# Patient Record
Sex: Female | Born: 1965 | Hispanic: Yes | Marital: Married | Smoking: Never smoker
Health system: Southern US, Community
[De-identification: ages and names within clinical notes are randomized; demographics above are authoritative.]

## PROBLEM LIST (undated history)

## (undated) HISTORY — PX: ABDOMINAL HYSTERECTOMY: SHX81

---

## 2004-11-05 ENCOUNTER — Emergency Department: Payer: Self-pay | Admitting: Emergency Medicine

## 2005-04-15 ENCOUNTER — Emergency Department: Payer: Self-pay | Admitting: Emergency Medicine

## 2007-07-08 ENCOUNTER — Other Ambulatory Visit: Payer: Self-pay

## 2007-07-08 ENCOUNTER — Emergency Department: Payer: Self-pay | Admitting: Emergency Medicine

## 2012-05-10 ENCOUNTER — Emergency Department: Payer: Self-pay | Admitting: Emergency Medicine

## 2013-07-27 ENCOUNTER — Emergency Department: Payer: Self-pay | Admitting: Emergency Medicine

## 2013-07-27 LAB — CBC
MCH: 26.6 pg (ref 26.0–34.0)
MCHC: 33.7 g/dL (ref 32.0–36.0)
MCV: 79 fL — ABNORMAL LOW (ref 80–100)
RBC: 5 10*6/uL (ref 3.80–5.20)
RDW: 17.4 % — ABNORMAL HIGH (ref 11.5–14.5)

## 2013-07-27 LAB — URINALYSIS, COMPLETE
Glucose,UR: NEGATIVE mg/dL (ref 0–75)
Nitrite: NEGATIVE
Ph: 7 (ref 4.5–8.0)
Protein: 100
RBC,UR: 9176 /HPF (ref 0–5)
Specific Gravity: 1.012 (ref 1.003–1.030)
WBC UR: 1306 /HPF (ref 0–5)

## 2013-07-27 LAB — WET PREP, GENITAL

## 2013-07-27 LAB — COMPREHENSIVE METABOLIC PANEL
Albumin: 3.7 g/dL (ref 3.4–5.0)
Alkaline Phosphatase: 119 U/L (ref 50–136)
BUN: 15 mg/dL (ref 7–18)
Bilirubin,Total: 0.3 mg/dL (ref 0.2–1.0)
Calcium, Total: 9 mg/dL (ref 8.5–10.1)
Chloride: 103 mmol/L (ref 98–107)
Creatinine: 0.88 mg/dL (ref 0.60–1.30)
EGFR (African American): >60
Glucose: 108 mg/dL — ABNORMAL HIGH (ref 65–99)
Osmolality: 273 (ref 275–301)
SGPT (ALT): 24 U/L (ref 12–78)

## 2013-07-27 LAB — HCG, QUANTITATIVE, PREGNANCY: Beta Hcg, Quant.: <1 m[IU]/mL — ABNORMAL LOW

## 2013-07-28 LAB — GC/CHLAMYDIA PROBE AMP

## 2013-11-28 ENCOUNTER — Emergency Department: Payer: Self-pay | Admitting: Emergency Medicine

## 2013-11-28 LAB — CBC
HCT: 37.7 % (ref 35.0–47.0)
HGB: 11.6 g/dL — ABNORMAL LOW (ref 12.0–16.0)
MCH: 24.6 pg — ABNORMAL LOW (ref 26.0–34.0)
MCHC: 30.9 g/dL — ABNORMAL LOW (ref 32.0–36.0)
MCV: 80 fL (ref 80–100)
PLATELETS: 412 10*3/uL (ref 150–440)
RBC: 4.73 10*6/uL (ref 3.80–5.20)
RDW: 17.2 % — ABNORMAL HIGH (ref 11.5–14.5)
WBC: 15.8 10*3/uL — AB (ref 3.6–11.0)

## 2013-11-28 LAB — COMPREHENSIVE METABOLIC PANEL
ALT: 22 U/L (ref 12–78)
Albumin: 3.4 g/dL (ref 3.4–5.0)
Alkaline Phosphatase: 131 U/L — ABNORMAL HIGH
Anion Gap: 7 (ref 7–16)
BILIRUBIN TOTAL: 0.3 mg/dL (ref 0.2–1.0)
BUN: 13 mg/dL (ref 7–18)
CALCIUM: 9 mg/dL (ref 8.5–10.1)
CO2: 25 mmol/L (ref 21–32)
CREATININE: 0.6 mg/dL (ref 0.60–1.30)
Chloride: 105 mmol/L (ref 98–107)
EGFR (African American): >60
GLUCOSE: 111 mg/dL — AB (ref 65–99)
Osmolality: 275 (ref 275–301)
Potassium: 4 mmol/L (ref 3.5–5.1)
SGOT(AST): 11 U/L — ABNORMAL LOW (ref 15–37)
SODIUM: 137 mmol/L (ref 136–145)
TOTAL PROTEIN: 8.2 g/dL (ref 6.4–8.2)

## 2013-11-28 LAB — URINALYSIS, COMPLETE
Bilirubin,UR: NEGATIVE
Glucose,UR: NEGATIVE mg/dL (ref 0–75)
Ketone: NEGATIVE
Nitrite: NEGATIVE
Ph: 6 (ref 4.5–8.0)
Protein: 100
SPECIFIC GRAVITY: 1.019 (ref 1.003–1.030)
WBC UR: 2435 /HPF (ref 0–5)

## 2013-11-30 LAB — URINE CULTURE

## 2014-01-18 LAB — URINALYSIS, COMPLETE
Bilirubin,UR: NEGATIVE
Glucose,UR: NEGATIVE mg/dL (ref 0–75)
Nitrite: NEGATIVE
PH: 6 (ref 4.5–8.0)
Protein: NEGATIVE
Specific Gravity: 1.01 (ref 1.003–1.030)
WBC UR: 6 /HPF (ref 0–5)

## 2014-01-18 LAB — CBC WITH DIFFERENTIAL/PLATELET
BASOS PCT: 0.5 %
Basophil #: 0.1 10*3/uL (ref 0.0–0.1)
Eosinophil #: 0 10*3/uL (ref 0.0–0.7)
Eosinophil %: 0.3 %
HCT: 36.5 % (ref 35.0–47.0)
HGB: 11.4 g/dL — AB (ref 12.0–16.0)
Lymphocyte #: 1.1 10*3/uL (ref 1.0–3.6)
Lymphocyte %: 7.1 %
MCH: 23.4 pg — AB (ref 26.0–34.0)
MCHC: 31.3 g/dL — ABNORMAL LOW (ref 32.0–36.0)
MCV: 75 fL — ABNORMAL LOW (ref 80–100)
MONO ABS: 0.7 x10 3/mm (ref 0.2–0.9)
Monocyte %: 4.3 %
NEUTROS ABS: 13.4 10*3/uL — AB (ref 1.4–6.5)
NEUTROS PCT: 87.8 %
Platelet: 360 10*3/uL (ref 150–440)
RBC: 4.87 10*6/uL (ref 3.80–5.20)
RDW: 17.5 % — ABNORMAL HIGH (ref 11.5–14.5)
WBC: 15.3 10*3/uL — ABNORMAL HIGH (ref 3.6–11.0)

## 2014-01-18 LAB — COMPREHENSIVE METABOLIC PANEL
ALK PHOS: 89 U/L
AST: 23 U/L (ref 15–37)
Albumin: 3.2 g/dL — ABNORMAL LOW (ref 3.4–5.0)
Anion Gap: 8 (ref 7–16)
BILIRUBIN TOTAL: 0.4 mg/dL (ref 0.2–1.0)
BUN: 7 mg/dL (ref 7–18)
Calcium, Total: 8.1 mg/dL — ABNORMAL LOW (ref 8.5–10.1)
Chloride: 106 mmol/L (ref 98–107)
Co2: 23 mmol/L (ref 21–32)
Creatinine: 0.71 mg/dL (ref 0.60–1.30)
EGFR (African American): >60
EGFR (Non-African Amer.): >60
Glucose: 126 mg/dL — ABNORMAL HIGH (ref 65–99)
Osmolality: 273 (ref 275–301)
Potassium: 3.5 mmol/L (ref 3.5–5.1)
SGPT (ALT): 20 U/L (ref 12–78)
Sodium: 137 mmol/L (ref 136–145)
TOTAL PROTEIN: 7.9 g/dL (ref 6.4–8.2)

## 2014-01-18 LAB — GC/CHLAMYDIA PROBE AMP

## 2014-01-18 LAB — WET PREP, GENITAL

## 2014-01-19 ENCOUNTER — Inpatient Hospital Stay: Payer: Self-pay | Admitting: Internal Medicine

## 2014-01-19 LAB — COMPREHENSIVE METABOLIC PANEL
ALBUMIN: 2.7 g/dL — AB (ref 3.4–5.0)
ANION GAP: 9 (ref 7–16)
Alkaline Phosphatase: 70 U/L
BUN: 6 mg/dL — AB (ref 7–18)
Bilirubin,Total: 0.6 mg/dL (ref 0.2–1.0)
CO2: 22 mmol/L (ref 21–32)
Calcium, Total: 7.5 mg/dL — ABNORMAL LOW (ref 8.5–10.1)
Chloride: 104 mmol/L (ref 98–107)
Creatinine: 0.86 mg/dL (ref 0.60–1.30)
EGFR (Non-African Amer.): >60
GLUCOSE: 111 mg/dL — AB (ref 65–99)
Osmolality: 268 (ref 275–301)
POTASSIUM: 3.2 mmol/L — AB (ref 3.5–5.1)
SGOT(AST): 12 U/L — ABNORMAL LOW (ref 15–37)
SGPT (ALT): 17 U/L (ref 12–78)
Sodium: 135 mmol/L — ABNORMAL LOW (ref 136–145)
TOTAL PROTEIN: 6.8 g/dL (ref 6.4–8.2)

## 2014-01-19 LAB — CBC WITH DIFFERENTIAL/PLATELET
BASOS ABS: 0 10*3/uL (ref 0.0–0.1)
Basophil %: 0.3 %
Eosinophil #: 0 10*3/uL (ref 0.0–0.7)
Eosinophil %: 0 %
HCT: 29.9 % — ABNORMAL LOW (ref 35.0–47.0)
HGB: 9.5 g/dL — ABNORMAL LOW (ref 12.0–16.0)
Lymphocyte #: 1 10*3/uL (ref 1.0–3.6)
Lymphocyte %: 7 %
MCH: 23.6 pg — ABNORMAL LOW (ref 26.0–34.0)
MCHC: 31.8 g/dL — ABNORMAL LOW (ref 32.0–36.0)
MCV: 74 fL — AB (ref 80–100)
MONO ABS: 0.7 x10 3/mm (ref 0.2–0.9)
Monocyte %: 4.9 %
NEUTROS ABS: 12.2 10*3/uL — AB (ref 1.4–6.5)
NEUTROS PCT: 87.8 %
Platelet: 282 10*3/uL (ref 150–440)
RBC: 4.05 10*6/uL (ref 3.80–5.20)
RDW: 17.4 % — AB (ref 11.5–14.5)
WBC: 13.8 10*3/uL — AB (ref 3.6–11.0)

## 2014-01-19 LAB — LIPID PANEL
Cholesterol: 143 mg/dL (ref 0–200)
HDL Cholesterol: 49 mg/dL (ref 40–60)
Ldl Cholesterol, Calc: 73 mg/dL (ref 0–100)
TRIGLYCERIDES: 104 mg/dL (ref 0–200)
VLDL CHOLESTEROL, CALC: 21 mg/dL (ref 5–40)

## 2014-01-19 LAB — URINE CULTURE

## 2014-01-19 LAB — TSH: Thyroid Stimulating Horm: 0.971 u[IU]/mL

## 2014-01-20 LAB — URINALYSIS, COMPLETE
BACTERIA: NONE SEEN
BILIRUBIN, UR: NEGATIVE
GLUCOSE, UR: NEGATIVE mg/dL (ref 0–75)
Leukocyte Esterase: NEGATIVE
Nitrite: NEGATIVE
PH: 7 (ref 4.5–8.0)
Protein: NEGATIVE
RBC,UR: <1 /HPF (ref 0–5)
SPECIFIC GRAVITY: 1.006 (ref 1.003–1.030)
WBC UR: <1 /HPF (ref 0–5)

## 2014-01-21 LAB — BASIC METABOLIC PANEL
Anion Gap: 6 — ABNORMAL LOW (ref 7–16)
BUN: 3 mg/dL — AB (ref 7–18)
CALCIUM: 7.8 mg/dL — AB (ref 8.5–10.1)
CREATININE: 0.54 mg/dL — AB (ref 0.60–1.30)
Chloride: 107 mmol/L (ref 98–107)
Co2: 27 mmol/L (ref 21–32)
EGFR (African American): >60
Glucose: 89 mg/dL (ref 65–99)
Osmolality: 275 (ref 275–301)
POTASSIUM: 3.4 mmol/L — AB (ref 3.5–5.1)
Sodium: 140 mmol/L (ref 136–145)

## 2014-01-21 LAB — TSH: THYROID STIMULATING HORM: 2.74 u[IU]/mL

## 2014-01-21 LAB — CBC WITH DIFFERENTIAL/PLATELET
Basophil #: 0 10*3/uL (ref 0.0–0.1)
Basophil %: 0.3 %
EOS ABS: 0.2 10*3/uL (ref 0.0–0.7)
Eosinophil %: 1.7 %
HCT: 26.8 % — AB (ref 35.0–47.0)
HGB: 8.6 g/dL — ABNORMAL LOW (ref 12.0–16.0)
Lymphocyte #: 1.4 10*3/uL (ref 1.0–3.6)
Lymphocyte %: 13.4 %
MCH: 23.6 pg — ABNORMAL LOW (ref 26.0–34.0)
MCHC: 32.1 g/dL (ref 32.0–36.0)
MCV: 74 fL — ABNORMAL LOW (ref 80–100)
MONO ABS: 0.6 x10 3/mm (ref 0.2–0.9)
MONOS PCT: 5.7 %
Neutrophil #: 8.5 10*3/uL — ABNORMAL HIGH (ref 1.4–6.5)
Neutrophil %: 78.9 %
PLATELETS: 295 10*3/uL (ref 150–440)
RBC: 3.63 10*6/uL — ABNORMAL LOW (ref 3.80–5.20)
RDW: 17.2 % — AB (ref 11.5–14.5)
WBC: 10.8 10*3/uL (ref 3.6–11.0)

## 2014-01-21 LAB — MAGNESIUM: MAGNESIUM: 2.1 mg/dL

## 2014-01-22 LAB — CBC WITH DIFFERENTIAL/PLATELET
BASOS ABS: 0.1 10*3/uL (ref 0.0–0.1)
Basophil %: 0.7 %
EOS ABS: 0.3 10*3/uL (ref 0.0–0.7)
EOS PCT: 3.7 %
HCT: 29.7 % — AB (ref 35.0–47.0)
HGB: 9.2 g/dL — AB (ref 12.0–16.0)
LYMPHS ABS: 1.6 10*3/uL (ref 1.0–3.6)
Lymphocyte %: 21.5 %
MCH: 23.1 pg — AB (ref 26.0–34.0)
MCHC: 31.1 g/dL — ABNORMAL LOW (ref 32.0–36.0)
MCV: 74 fL — ABNORMAL LOW (ref 80–100)
MONO ABS: 0.5 x10 3/mm (ref 0.2–0.9)
Monocyte %: 7 %
NEUTROS ABS: 5 10*3/uL (ref 1.4–6.5)
Neutrophil %: 67.1 %
PLATELETS: 379 10*3/uL (ref 150–440)
RBC: 3.99 10*6/uL (ref 3.80–5.20)
RDW: 17.4 % — AB (ref 11.5–14.5)
WBC: 7.5 10*3/uL (ref 3.6–11.0)

## 2014-01-22 LAB — IRON AND TIBC
IRON BIND. CAP.(TOTAL): 254 ug/dL (ref 250–450)
IRON SATURATION: 5 %
Iron: 12 ug/dL — ABNORMAL LOW (ref 50–170)
Unbound Iron-Bind.Cap.: 242 ug/dL

## 2014-01-22 LAB — FERRITIN: Ferritin (ARMC): 43 ng/mL (ref 8–388)

## 2014-01-23 LAB — CULTURE, BLOOD (SINGLE)

## 2015-01-16 NOTE — Consult Note (Signed)
Brief Consult Note: Diagnosis: abnormal uterine bleeding, likely secondar to fibroid uterus.   Patient was seen by consultant.   Consult note dictated.   Recommend further assessment or treatment.   Discussed with Attending MD.   Comments: She has had initial evaluation at [facility] for this issue, which has been going on for about 6 months. She has tried combined oral contraceptive pills, which she states caused her to have inflamed veins in around her knees.  So, she stopped taking them.   I recommended that she follow up in her clinic for other treatment options full evaluation to rule out malignant and premalignant causes to her heavy bleeding.   For now, I recommend provera 10mg  po qdaily x 10 days should her heavy bleeding return.  If she does not feel like she needs it in the hospital, then I recommend providing her with a prescription for one 10-day course to allow her time to be seen and evaluated on an outpatient basis. Thank you for this consult.  Electronic Signatures: Conard NovakJackson, Kimberli Winne D (MD)  (Signed 30-Apr-15 21:06)  Authored: Brief Consult Note   Last Updated: 30-Apr-15 21:06 by Conard NovakJackson, Sam Overbeck D (MD)

## 2015-01-16 NOTE — Discharge Summary (Signed)
PATIENT NAME:  Jacqueline FritzRIAS, [name]:  [id number] DATE OF BIRTH:  [birth year]  DATE OF ADMISSION:  01/19/2014 DATE OF DISCHARGE:  01/23/2014   ADMITTING PHYSICIAN: Hope PigeonVaibhavkumar G. Elisabeth PigeonVachhani, MD  DISCHARGING PHYSICIAN: Enid Baasadhika Kaidan Harpster, MD  PRIMARY CARE PHYSICIAN: Used to be at [facility], currently none.  CONSULTATIONS IN THE HOSPITAL: OB/GYN consultation by Conard NovakStephen D. Jackson, MD.   DISCHARGE DIAGNOSES: 1.  Systemic inflammatory response syndrome. 2.  Group A strep urinary tract infection.  3.  Gastroenteritis.  4.  Constipation.  5.  Dysmenorrhea. 6.  Fibroid uterus. 7.  Anxiety and panic attacks.  8.  Chronic anemia.   DISCHARGE HOME MEDICATIONS: 1.  Norco 5/325 mg 1 tablet q.6 hours p.r.n. for pain.  2.  Promethazine 25 mg q.6 hours p.r.n. for nausea and vomiting.  3.  Senna/Colace 50/8.6 mg 2 tablets once a day.  4.  Keflex 500 mg p.o. t.i.d. for 4 more days.  5.  Ferrous sulfate 325 mg p.o. daily.  6.  Celexa 20 mg p.o. daily.  7.  Provera 10 mg p.o. daily.  8.  Lactulose 30 mL daily p.r.n. for constipation.   DISCHARGE DIET: Regular diet.   DISCHARGE ACTIVITY: As tolerated.    FOLLOWUP INSTRUCTIONS:  1.  PCP followup in one week.  2.  OB/GYN followup in 1 to 2 weeks.   LABORATORY AND IMAGING STUDIES PRIOR TO DISCHARGE: WBC 7.5, hemoglobin 9.3, hematocrit 29.7; platelet count is 379.   Sodium 140, potassium 3.4, chloride 107, bicarbonate 27, BUN 3, creatinine 0.54, glucose 89, and calcium of 7.8.   HIV antibodies were negative. Serum TSH is 2.74. Serum iron low at 12. Iron saturation is 5%. Ferritin is at 43.   Urinalysis: Repeat urinalysis is negative. Initial urine cultures on admission growing group A Strep pyogenes, significant colonies greater than 100,000.   Blood cultures negative and vaginal cultures negative for any Chlamydia or gonorrhea.   CT of the head without contrast showing no acute intracranial abnormalities. Abdominal x-ray showing  nonspecific bowel gas pattern. CT of the abdomen and pelvis with contrast showing no acute abnormality, fibroid uterus and large left ovary, likely from luteal cyst. Followup recommended in 6 to 12 weeks.   BRIEF HOSPITAL COURSE: Ms. Jacqueline Pugh is a 49 year old, Spanish-speaking Hispanic female with no significant past medical history, comes to the hospital secondary to abdominal pain, nausea and vomiting.  1.  Abdominal pain with nausea, vomiting: Likely gastroenteritis and also the patient had UTI. Her urine cultures were growing significant colonies of Strep pyogenes, which is very unusual. The patient states she is sexually active, monogamous. HIV was tested, which was negative. She is placed on Rocephin in the hospital and is being changed over to Keflex. Her abdominal pain was also secondary to constipation. In spite of multiple medications, she did not have a bowel movement. However, after she had a bowel movement, most of her symptoms have improved. Abdominal x-ray did not show any obstruction. She was able to tolerate her diet well.  2.  Dysmenorrhea and fibroid uterus: The patient has been having worsening of her periods over the last 6 months. This time, she had for more than 12 days. OB/GYN has been consulted in the hospital. They recommended placing the patient on Provera for 10 days and outpatient followup. CT of the abdomen showed fibroid uterus.  3.  Anxiety episodes with symptoms of panic attacks: The patient is being started on Celexa in the hospital and needs to be followed as an  outpatient.   Her course has been otherwise uneventful in the hospital.   DISCHARGE CONDITION: Stable.   DISCHARGE DISPOSITION: Home.   TIME SPENT ON DISCHARGE: 40 minutes.    ____________________________ Enid Baas, MD rk:jcm D: 01/23/2014 13:57:52 ET T: 01/23/2014 14:51:58 ET JOB#: [id number]  cc: Enid Baas, MD, <Dictator> Enid Baas MD ELECTRONICALLY SIGNED 02/11/2014 14:02

## 2015-01-16 NOTE — H&P (Signed)
PATIENT NAME:  Jacqueline Pugh, Jacqueline Pugh MR#:  [id number] DATE OF BIRTH:  10/04/1965  DATE OF ADMISSION:  01/18/2014  PRIMARY CARE PHYSICIAN: At [facility], but not going regularly.   REFERRING EMERGENCY ROOM PHYSICIAN: Dr. Janalyn Harderavid Kaminski  CHIEF COMPLAINT: Abdominal pain.   HISTORY OF PRESENT ILLNESS: A 49 year old female with no known past medical history, but not going to a doctor anyways. She is obese and, as per her, a long time ago in the past she was told to check a thyroid sonogram for some reason, but she never went for that. Now for the last 3 days, she started having some pain, which is epigastric and both sides of the abdomen, which is more when she pressed on it. The pain is like 6 to 7 out of 10 on and off, also feeling somewhat nauseous. She felt some warm and chills yesterday. Because of this, she did not eat enough in the last 3 days, and also not drinking enough. She decided to come to the Emergency Room for this reason. In the ER, a CAT scan of abdomen was done, which does not show any significant finding, but some ovarian enlargement, to be followed in few weeks. She was given morphine and then Dilaudid injection, which helped to relieve the pain, so currently the pain is 3 to 4 out of 10, and it is only if she presses down on her abdomen. Other than this, she also had some headache, some hurting of her muscles in her lower limbs, both sides, which is gone now with IV fluids in the ER. She was found having tachycardia of 120 to 130 in ER, and given 3 to 4 liters of fluids so far, and given to hospitalist service for further management.   REVIEW OF SYSTEMS:  CONSTITUTIONAL: Had some fever and chills. Also had some fatigue. No weakness, pain or weight loss.  EYES: No blurring, double vision, discharge or redness.  EARS, NOSE, THROAT: No tinnitus, ear pain or hearing loss.  RESPIRATORY: No cough, wheezing, hemoptysis, or shortness of breath.  CARDIOVASCULAR: No chest pain, orthopnea,  edema, arrhythmia or palpitations.  GASTROINTESTINAL: The patient had some nausea, but no vomiting or diarrhea. Had abdominal pain also.  GENITOURINARY: No dysuria, hematuria, increased frequency.  ENDOCRINE: No heat or cold intolerance. No increased sweating.  SKIN: No acne, rashes, or lesions.  MUSCULOSKELETAL: No pain or swelling in the joints, but has some overall generalized pain in both legs, which is subsided now.  NEUROLOGICAL: No numbness, weakness, tremor or vertigo.  PSYCHIATRIC: No anxiety, insomnia, bipolar disorder.   PAST MEDICAL HISTORY: Not known. Not going to any doctor.   HOME MEDICATIONS: Not taking any medication at home.   PAST SURGICAL HISTORY: Had some kind of abnormal cells in her cervix, so some small procedure was done for that. She does not remember what exactly it was.  SOCIAL HISTORY: She denies any alcohol, illegal drug use, or smoking. She is a housewife.   FAMILY HISTORY: Mother had hyperlipidemia.   VITAL SIGNS: In the ER, temperature was 98.2, pulse rate was 140 on presentation, which came down currently to 110, respiration was 18, blood pressure was 104/59, and pulse ox was 96% on room air.   PHYSICAL EXAMINATION: GENERAL: The patient is fully alert and oriented to time, place, and person.  HEENT: Head and neck atraumatic. Conjunctivae pink. Oral mucosa moist.  NECK: Supple. No JVD.  RESPIRATORY: Bilaterally equal and clear air entry.  CARDIOVASCULAR: S1, S2 present. Slight tachycardia.  No murmur.  ABDOMEN: Soft. Mild tenderness in epigastric. No organomegaly felt. Bowel sounds normal.  SKIN: No rashes.  LEGS:  No edema. There is some tenderness of calf.   NEUROLOGICAL: No numbness, weakness, tremor, or vertigo. Power 5/5. Follows commands. Moves all 4 limbs.  PSYCHIATRIC: Does not appear in any acute psychiatric illness at this time.   IMPORTANT LAB RESULTS: Glucose 126, BUN 7, creatinine 0.71, sodium 137, potassium 3.5, chloride 106, CO2 is 23,  calcium 8.1. Total protein 7.9, albumin 3.2, bilirubin 0.4, alkaline phosphate 89, SGOT 23, SGPT 20. WBC 15.3, hemoglobin 11.4, platelet count 360, MCV 75. Urinalysis is borderline, with 6 WBCs and 1+ leukocyte esterase. CT scan of the abdomen and pelvis:  No acute abnormality, fibroid uterus, enlarged left ovary, needs 6 to 12 week follow up with ultrasound, borderline left periaortic retroperitoneal lymph nodes, nonspecific.   ASSESSMENT AND PLAN: A 49 year old female with no known past medical history, not following with doctor. Came with a complaint of abdominal pain, some nausea, and some chills. Pain is a little better after receiving IV fluids and pain medication here. Also found having tachycardia.   ASSESSMENT AND PLAN: 1.  Gastroenteritis. Most likely symptoms are because of acute viral gastroenteritis. Will give her IV fluid, Zofran, and Dilaudid for symptomatic management. Currently I do not think there is any need for antibiotics.   2.  History of some thyroid problem. The patient does not remember exactly, but says she was told to have some thyroid sonogram study in the past, long ago, but she never followed. Sometimes she has some complaint of pain in the neck, but not now. We would like to check thyroid level currently. If it is normal, then I would just leave it for her to follow with her primary care doctor.  3.  Tachycardia. Most likely this is due to pain and somewhat dehydration because of not eating. After having 3 to 4 liters of IV fluid, heart rate, which was running around 140, currently now came down in the range of 100. Will continue the same management, and monitor.   Total time spent on this admission is 50 minutes.    ____________________________ Hope Pigeon Elisabeth Pigeon, MD vgv:mr D: 01/18/2014 18:58:02 ET T: 01/18/2014 20:39:52 ET JOB#: [id number]  cc: Hope Pigeon. Elisabeth Pigeon, MD, <Dictator> Altamese Dilling MD ELECTRONICALLY SIGNED 01/20/2014 12:06

## 2015-01-16 NOTE — Consult Note (Signed)
PATIENT NAME:  Jacqueline Pugh, Jacqueline Pugh MR#:  [id number] DATE OF BIRTH:  1966-08-04  DATE OF CONSULTATION:  01/22/2014  REQUESTING PHYSICIAN:  Dr. Nemiah CommanderKalisetti CONSULTING PHYSICIAN:  Conard NovakStephen D. Hortensia Duffin, MD  REASON FOR CONSULT: Heavy vaginal bleeding in the setting of fibroid uterus.   HISTORY OF PRESENT ILLNESS: Jacqueline Pugh is a 49 year old gravida 696, para 5-1-0-6 female who was hospitalized on April 26th and ultimately diagnosed with a urinary tract infection that required inpatient antibiotics as well as several other minor diagnoses which required treatment. On evaluation, it was noted that she had complaints of heavy vaginal bleeding and passing clots. She had fibroids noted on CT scan as well as a left ovarian cyst that was measured 5.4 cm in length. Gynecology service was consulted for management of her heavy bleeding given her mild anemia and low iron counts. The patient states that for the past 6 months she has had heavy and irregular bleeding. Prior to this, she had regular periods coming once monthly. More recently, she will have months where she has only spotting and other months where she will have very heavy bleeding, passing large clots. She has been evaluated for [facility] at [facility] and has been given what she describes sounds like a combined oral contraceptive pill which she states did help with her bleeding; however, it caused some inflammation of the veins around her knees and, therefore, she stopped taking the medication. She was also told that she had fibroids or more specifically tumors on her uterus, but no further elaboration was given to her according to her. She plans to establish care with a local clinic in [place] as [facility] is some distance from her home. Today, she is having some bleeding, passing only very small clots occasionally. Her bleeding, she states, is much lighter today.   PAST MEDICAL HISTORY: The patient denies.   PAST SURGICAL HISTORY: None.   CURRENT MEDICATIONS AT  HOME:  None.  ALLERGIES: No known drug allergies. The patient describes a treatment with a medication which caused her to have itchy skin but she does not recall the name of the medication.   OBSTETRICAL AND GYNECOLOGICAL HISTORY: Menarche at age 49 with cycles coming once every 4 weeks. Her last period began on April 19th and is ongoing at this point. The patient notes a history of abnormal Pap smears approximately 4 years ago. She states this required a procedure to remove the abnormal cells and she had a followup Pap smear more recently that she states was normal.   FAMILY HISTORY: Noncontributory. Specifically, she denies a history of bleeding disorders, uterine cancers or  cancers of any sort.   SOCIAL HISTORY: The [place] is from [place]. She denies tobacco, alcohol, and drug use. She is a Futures traderhomemaker.   REVIEW OF SYSTEMS: Negative x 10 systems reviewed unless otherwise noted in the HPI.   PHYSICAL EXAMINATION:  VITAL SIGNS: T Current 37.1 degrees Celsius (98.8 Fahrenheit), pulse 86, respiratory rate 20, blood pressure 110/76, oxygen saturation is 98% on room air.  GENERAL: Pleasant female in no apparent distress.  HEENT: Normocephalic and atraumatic.  CARDIOVASCULAR: Regular rate and rhythm.  PULMONARY: Clear to auscultation bilaterally.  BACK:  No CVA tenderness.  ABDOMEN: Soft. Mildly tender to palpation in the suprapubic region. There is some mild voluntary guarding so any masses in the abdomen would be difficult to palpate or enlarged uterus would also be difficult to palpate. There was no rebound tenderness and she had no obvious skin lesions.  PELVIC:  Deferred.  EXTREMITIES: No erythema, cords, or tenderness. She does have some varicosities in her lower extremities.   LABORATORY DATA:  (Pertinent only)  1. On 01/22/2014, CBC: WBCs 7.5, hemoglobin 9.2, hematocrit 29.7, platelets 379,000.  2. On 01/21/2014, chemistry panel: creatinine 0.54, glucose 89.  3. On 01/21/2014, TSH  equals 2.74.  4. Urine culture greater than 1000 colony-forming units per milliliter of Streptococcus pyogenes (group A).  5. Blood cultures on 01/18/2014 negative x 2.  6. Gonorrhea Chlamydia, nucleic acid amplification test negative for both.  7. Wet prep on 01/18/2014 was negative.   IMAGING DATA:  CT scan report dated 01/18/2014 noted an enlarged fibroid uterus, left ovary enlarged with a corpus luteal cyst measuring 5.4 cm in the long axis. Right ovary appears normal with no free fluid in the pelvis.   ASSESSMENT AND RECOMMENDATIONS: This is a 49 year old female with abnormal uterine bleeding, most likely due to a fibroid uterus. It appears she has been evaluated for this in the past. It is unclear whether she has had a complete evaluation to rule out malignancy.   Recommendations discussed in detail with the patient. Recommendations for followup to ensure that malignancy has been ruled out. Discussed that fibroids are a very common thing in women in their 78s and that this may very well explain her bleeding. However, given how common cancers of the uterus are, it would be in her best interest to get treatment. We discussed potential treatments if malignancy or premalignancy is ruled out including no treatment with simple clinical observation, hormonal treatment with progestin only or some other form that does not cause her to have this inflammation response, and potentially surgery if her symptoms are ultimately overwhelming to her and she chooses to go the surgical route.  In the short term until she is able to establish care locally, which she states she is trying to do as she is switching from [facility] to find a closer clinic, I recommend that she be given Provera 10 mg to be taken by mouth once daily for 10 days. Given that her bleeding is not very heavy today, she may want to forgo that treatment currently. I recommend that she be given a prescription at discharge for the above  treatment to be started based upon very heavy vaginal bleeding, which the patient and I discussed together to be taken as described above. The patient's questions were answered.  A Spanish-speaking interpreter was present with me throughout the entire interview and exam.    ____________________________ Conard Novak, MD sdj:dd D: 01/22/2014 21:16:26 ET T: 01/22/2014 23:21:13 ET JOB#: [id number]  cc: Conard Novak, MD, <Dictator> Conard Novak MD ELECTRONICALLY SIGNED 02/24/2014 11:05

## 2015-04-14 ENCOUNTER — Ambulatory Visit
Admission: RE | Admit: 2015-04-14 | Discharge: 2015-04-14 | Disposition: A | Discharge status: Home / Self Care | Payer: Self-pay | Source: Ambulatory Visit | Attending: Oncology | Admitting: Oncology

## 2015-04-14 ENCOUNTER — Ambulatory Visit: Payer: Self-pay | Attending: Oncology

## 2015-04-14 VITALS — BP 119/82 | HR 86 | Temp 98.8°F | Resp 18 | Ht 61.42 in | Wt 209.5 lb

## 2015-04-14 DIAGNOSIS — Z0000 Encounter for general adult medical examination without abnormal findings: Secondary | ICD-10-CM

## 2015-04-14 NOTE — Progress Notes (Signed)
Subjective:     Patient ID: Jacqueline Pugh, female   DOB: 04/19/1966, 49 y.o.   MRN: [id number]  HPI   Review of Systems     Objective:   Physical Exam  Pulmonary/Chest: Right breast exhibits no inverted nipple, no mass, no nipple discharge, no skin change and no tenderness. Left breast exhibits no inverted nipple, no mass, no nipple discharge, no skin change and no tenderness. Breasts are symmetrical.       Assessment:     49 [facility]  Patient screened, and meets [facility] eligibility.  Patient does not have insurance, Medicare or Medicaid.  Handout given on Affordable Care Act.  CBE unremarkable.  Instructed patient on breast self-exam using teach back method    Plan:     Sent for bilateral screening mammogram.  Delos HaringLoyda Murr interpreted exam.

## 2015-04-21 NOTE — Progress Notes (Unsigned)
Letter mailed from [facility] to notify of normal mammogram results.  Patient to return in one year for annual screening.  Copy to [facility]. 

## 2015-06-01 ENCOUNTER — Emergency Department: Payer: Self-pay

## 2015-06-01 ENCOUNTER — Emergency Department
Admission: EM | Admit: 2015-06-01 | Discharge: 2015-06-01 | Disposition: A | Discharge status: Home / Self Care | Payer: Self-pay | Attending: Emergency Medicine | Admitting: Emergency Medicine

## 2015-06-01 ENCOUNTER — Encounter: Payer: Self-pay | Admitting: Emergency Medicine

## 2015-06-01 ENCOUNTER — Other Ambulatory Visit: Payer: Self-pay

## 2015-06-01 DIAGNOSIS — N760 Acute vaginitis: Secondary | ICD-10-CM | POA: Insufficient documentation

## 2015-06-01 DIAGNOSIS — Z3202 Encounter for pregnancy test, result negative: Secondary | ICD-10-CM | POA: Insufficient documentation

## 2015-06-01 DIAGNOSIS — R079 Chest pain, unspecified: Secondary | ICD-10-CM | POA: Insufficient documentation

## 2015-06-01 LAB — CBC
HCT: 33.1 % — ABNORMAL LOW (ref 35.0–47.0)
Hemoglobin: 10.6 g/dL — ABNORMAL LOW (ref 12.0–16.0)
MCH: 24.2 pg — ABNORMAL LOW (ref 26.0–34.0)
MCHC: 32.2 g/dL (ref 32.0–36.0)
MCV: 75 fL — ABNORMAL LOW (ref 80.0–100.0)
Platelets: 434 10*3/uL (ref 150–440)
RBC: 4.41 MIL/uL (ref 3.80–5.20)
RDW: 16.6 % — ABNORMAL HIGH (ref 11.5–14.5)
WBC: 10.3 10*3/uL (ref 3.6–11.0)

## 2015-06-01 LAB — BASIC METABOLIC PANEL
Anion gap: 8 (ref 5–15)
BUN: 16 mg/dL (ref 6–20)
CO2: 24 mmol/L (ref 22–32)
Calcium: 8.7 mg/dL — ABNORMAL LOW (ref 8.9–10.3)
Chloride: 97 mmol/L — ABNORMAL LOW (ref 101–111)
Creatinine, Ser: 0.77 mg/dL (ref 0.44–1.00)
GFR calc Af Amer: >60 mL/min (ref >60)
GFR calc non Af Amer: >60 mL/min (ref >60)
Glucose, Bld: 122 mg/dL — ABNORMAL HIGH (ref 65–99)
Potassium: 3.7 mmol/L (ref 3.5–5.1)
Sodium: 129 mmol/L — ABNORMAL LOW (ref 135–145)

## 2015-06-01 LAB — PREGNANCY, URINE: Preg Test, Ur: NEGATIVE

## 2015-06-01 LAB — URINALYSIS COMPLETE WITH MICROSCOPIC (ARMC ONLY)
Bilirubin Urine: NEGATIVE
Glucose, UA: NEGATIVE mg/dL
Ketones, ur: NEGATIVE mg/dL
NITRITE: NEGATIVE
PH: 6 (ref 5.0–8.0)
PROTEIN: NEGATIVE mg/dL
Specific Gravity, Urine: 1.005 (ref 1.005–1.030)

## 2015-06-01 LAB — TROPONIN I: Troponin I: <0.03 ng/mL (ref <0.031)

## 2015-06-01 LAB — CHLAMYDIA/NGC RT PCR (ARMC ONLY)
Chlamydia Tr: NOT DETECTED
N gonorrhoeae: NOT DETECTED

## 2015-06-01 MED ORDER — FLUCONAZOLE 150 MG PO TABS: 150.0000 mg | ORAL_TABLET | Freq: Every day | ORAL | Status: DC

## 2015-06-01 MED ORDER — SULFAMETHOXAZOLE-TRIMETHOPRIM 800-160 MG PO TABS: 1.0000 | ORAL_TABLET | Freq: Two times a day (BID) | ORAL | Status: DC

## 2015-06-01 MED ORDER — DIAZEPAM 5 MG PO TABS: 5.0000 mg | ORAL_TABLET | Freq: Three times a day (TID) | ORAL | Status: DC | PRN

## 2015-06-01 NOTE — ED Notes (Signed)
Via language line, pt having cp, both arms are hurting, feels like she is suffocating, feels "needles" in her feet at times, and has itching in her vagina at night time. Pt appears in no distress.

## 2015-06-01 NOTE — ED Provider Notes (Signed)
[facility] Emergency Department Provider Note     Time seen: ----------------------------------------- 3:45 PM on 06/01/2015 -----------------------------------------    I have reviewed the triage vital signs and the nursing notes.   HISTORY  Chief Complaint Chest Pain; Arm Pain; Foot Pain; and Vaginal Itching    HPI Jacqueline Pugh is a 49 y.o. female who presents ER for both arms hurting, chest pain feeling like she is suffocating. Patient states she feels like she has needles in her feet at times, she has itching in her vagina at night. She also is concerned she may have an allergic reaction to the antibiotic she got a [facility] but she can't refer the name of them.   History reviewed. No pertinent past medical history.  There are no active problems to display for this patient.   History reviewed. No pertinent past surgical history.  Allergies Review of patient's allergies indicates not on file.  Social History Social History  Substance Use Topics  . Smoking status: Never Smoker   . Smokeless tobacco: None  . Alcohol Use: None    Review of Systems Constitutional: Negative for fever. Eyes: Negative for visual changes. ENT: Negative for sore throat. Cardiovascular: Positive for chest pain Respiratory: Positive for shortness of breath Gastrointestinal: Negative for abdominal pain, vomiting and diarrhea. Genitourinary: Negative for dysuria. Positive for vaginal itching Musculoskeletal: Negative for back pain. Skin: Negative for rash. Neurological: Negative for headaches, focal weakness or numbness.  10-point ROS otherwise negative.  ____________________________________________   PHYSICAL EXAM:  VITAL SIGNS: ED Triage Vitals  Enc Vitals Group     BP 06/01/15 1504 98/74 mmHg     Pulse Rate 06/01/15 1504 94     Resp 06/01/15 1504 18     Temp 06/01/15 1504 98 F (36.7 C)     Temp Source 06/01/15 1504 Oral     SpO2 06/01/15 1504  96 %     Weight 06/01/15 1512 213 lb 3.2 oz (96.707 kg)     Height 06/01/15 1512  (1.6 m)     Head Cir --      Peak Flow --      Pain Score 06/01/15 1506 4     Pain Loc --      Pain Edu? --      Excl. in GC? --     Constitutional: Alert and oriented. Well appearing and in no distress. Eyes: Conjunctivae are normal. PERRL. Normal extraocular movements. ENT   Head: Normocephalic and atraumatic.   Nose: No congestion/rhinnorhea.   Mouth/Throat: Mucous membranes are moist.   Neck: No stridor. Cardiovascular: Normal rate, regular rhythm. Normal and symmetric distal pulses are present in all extremities. No murmurs, rubs, or gallops. Respiratory: Normal respiratory effort without tachypnea nor retractions. Breath sounds are clear and equal bilaterally. No wheezes/rales/rhonchi. Gastrointestinal: Soft and nontender. No distention. No abdominal bruits.  Musculoskeletal: Nontender with normal range of motion in all extremities. No joint effusions.  No lower extremity tenderness nor edema. Neurologic:  Normal speech and language. No gross focal neurologic deficits are appreciated. Speech is normal. No gait instability. Skin:  Skin is warm, dry and intact. No rash noted. Psychiatric: Mood and affect are normal. Speech and behavior are normal. Patient exhibits appropriate insight and judgment. ____________________________________________  EKG: Interpreted by me. EKG reveals normal sinus rhythm with rate of 91 bpm, normal axis normal intervals. No evidence of hypertrophy or acute infarction. Grossly unremarkable EKG  ____________________________________________  ED COURSE:  Pertinent labs & imaging  results that were available during my care of the patient were reviewed by me and considered in my medical decision making (see chart for details).  ____________________________________________    LABS (pertinent positives/negatives)  Labs Reviewed  CBC - Abnormal; Notable for  the following:    Hemoglobin 10.6 (*)    HCT 33.1 (*)    MCV 75.0 (*)    MCH 24.2 (*)    RDW 16.6 (*)    All other components within normal limits  BASIC METABOLIC PANEL  TROPONIN I    RADIOLOGY Images were viewed by me  Chest x-ray Reveals left-sided lung nodule. CT of the chest was performed without contrast ____________________________________________  FINAL ASSESSMENT AND PLAN  Chest pain, anxiety, vaginitis  Plan: Patient with labs and imaging as dictated above. Patient was discharged with Valium to take as needed for her symptoms as well as Diflucan 1 by mouth. She'll be on a short course of Septra for UTI. She is stable for discharge   Emily Filbert, MD   Emily Filbert, MD 06/02/15 [phone]

## 2015-06-01 NOTE — Discharge Instructions (Signed)
Dolor de pecho (no específico) °(Chest Pain (Nonspecific)) °Con frecuencia es difícil dar un diagnóstico específico de la causa del dolor de pecho. Siempre hay una posibilidad de que el dolor podría estar relacionado con algo grave, como un ataque al corazón o un coágulo sanguíneo en los pulmones. Debe someterse a controles con el médico para más evaluaciones. °CAUSAS  °· Acidez. °· Neumonía o bronquitis. °· Ansiedad o estrés. °· Inflamación de la zona que rodea al corazón (pericarditis) o a los pulmones (pleuritis o pleuresía). °· Un coágulo sanguíneo en el pulmón. °· Colapso de un pulmón (neumotórax), que puede aparecer de manera repentina por sí solo (neumotórax espontáneo) o debido a un traumatismo en el tórax. °· Culebrilla (virus del herpes zóster). °La pared torácica está compuesta por huesos, músculos y cartílago. Cualquiera de estos puede ser la fuente del dolor. °· Puede haber una contusión en los huesos debido a una lesión. °· Puede haber un esguince en los músculos o el cartílago ocasionado por la tos o por trabajo excesivo. °· El cartílago puede verse afectado por una inflamación y provocar dolor (costocondritis). °DIAGNÓSTICO  °Quizás se necesiten análisis de laboratorio u otros estudios para encontrar la causa del dolor. Además, puede indicarle que se haga una prueba llamada electrocadiograma (ECG) ambulatorio. El ECG registra los patrones de los latidos cardíacos durante 24 horas. Además, pueden hacerle otros estudios, por ejemplo: °· Ecocardiograma transtorácico (ETT). Durante el ecocardiograma, se usan ondas sonoras para evaluar el flujo de la sangre a través del corazón. °· Ecocardiograma transesofágico (ETE). °· Monitoreo cardíaco. Permite que el médico controle la frecuencia y el ritmo cardíaco en tiempo real. °· Monitor Holter. Es un dispositivo portátil que registra los latidos cardíacos y ayuda a diagnosticar las arritmias cardíacas. Le permite al médico registrar la actividad cardíaca  durante varios días, si es necesario. °· Pruebas de estrés por ejercicio o por medicamentos que aceleran los latidos cardíacos. °TRATAMIENTO  °· El tratamiento depende de la causa del dolor de pecho. El tratamiento puede incluir: °¨ Inhibidores de la acidez estomacal. °¨ Antiinflamatorios. °¨ Analgésicos para las enfermedades inflamatorias. °¨ Antibióticos, si hay una infección. °· Podrán aconsejarle que modifique su estilo de vida. Esto incluye dejar de fumar y evitar el alcohol, la cafeína y el chocolate. °· Pueden aconsejarle que mantenga la cabeza levantada (elevada) cuando duerme. Esto reduce la probabilidad de que el ácido retroceda del estómago al esófago. °En la mayoría de los casos, el dolor de pecho no específico mejorará en el término de 2 a 3 días, con reposo y analgésicos suaves.  °INSTRUCCIONES PARA EL CUIDADO EN EL HOGAR  °· Si le prescriben antibióticos, tómelos tal como se le indicó. Termínelos aunque comience a sentirse mejor. °· Durante los días siguientes, no haga actividades físicas que provoquen dolor de pecho. Continúe con las actividades físicas tal como se le indicó °· No consuma ningún producto que contenga tabaco, incluidos cigarrillos, tabaco de mascar o cigarrillos electrónicos. °· Evite el consumo de alcohol. °· Tome los medicamentos solamente como se lo haya indicado el médico. °· Siga las sugerencias del médico en lo que respecta a las pruebas adicionales, si el dolor de pecho no desaparece. °· Concurra a todas las visitas de control programadas. Si no lo hace, podría desarrollar problemas permanentes (crónicos) relacionados con el dolor. Si hay algún problema para concurrir a una cita, llame para reprogramarla. °SOLICITE ATENCIÓN MÉDICA SI:  °· El dolor de pecho no desaparece, incluso después del tratamiento. °· Tiene una erupción cutánea con ampollas en el   pecho.  Lance Muss. SOLICITE ATENCIN MDICA DE [employer] SI:   Aumenta el dolor de pecho o este se irradia hacia el  brazo, el cuello, la [employer], la espalda o el abdomen.  Le falta el aire.  La tos empeora, o expectora sangre.  Siente dolor intenso en la espalda o el abdomen.  Se siente nauseoso o vomita.  Siente debilidad intensa.  Se desmaya.  Tiene escalofros. Esto es Radio broadcast assistant. No espere a ver si el dolor se pasa. Obtenga ayuda mdica de inmediato. Llame a los servicios de emergencia locales (911 en los [place]). No conduzca por sus propios medios [facility]. ASEGRESE DE QUE:   Comprende estas instrucciones.  Controlar su afeccin.  Recibir ayuda de inmediato si no mejora o si empeora. Document Released: 09/11/2005 Document Revised: 09/16/2013 [facility] Patient Information 2015 [place], [place]. This information is not intended to replace advice given to you by your health care provider. Make sure you discuss any questions you have with your health care provider.  Vaginitis  (Vaginitis)  La vaginitis es la inflamacin de la vagina. Generalmente se debe a un cambio en el equilibrio normal de las bacterias y hongos que viven en la vagina. Este cambio en el equilibrio causa un crecimiento excesivo de ciertas bacterias y hongos, lo que causa la inflamacin. Hay diferentes tipos de vaginitis, pero los ms comunes son:   Vaginitis bacteriana.  Infeccin por hongos (candidiasis).  Vaginitis por tricomoniasis. Esta es una enfermedad de transmisin sexual (ETS).  Vaginitis viral.  Vaginitis atrfica.  Vaginitis alrgica. CAUSAS  El tratamiento depende del tipo de vaginitis. Las causas pueden ser:   Bacterias (vaginitis bacteriana).  Hongos (infeccin por hongos).  Parsitos (vaginitis por tricomoniasis).  Virus (vaginitis viral).  Niveles hormonales bajos (vaginitis atrfica). Los niveles bajos de hormonas pueden ocurrir durante el Psychiatrist, la Market researcher o despus de la menopausia.  Irritantes, como los baos de [place], los tampones perfumados y los aerosoles  femeninos (vaginitis Counselling psychologist). Otros factores pueden alterar el equilibrio normal de los hongos y las bacterias que viven en la vagina. Ellos son:   Antibiticos.  Higiene personal deficiente.  Diafragmas, esponjas vaginales, espermicidas, pldoras anticonceptivas y dispositivos intrauterinos (DIU).  Las [employer].  Infecciones.  La diabetes no controlada.  Tener un sistema inmunolgico debilitado. SNTOMAS  Los sntomas pueden variar segn la causa de la vaginitis. Los sntomas ms comunes son:   Flujo vaginal anormal.  La secrecin es de color blanco, gris o amarillento en la vaginitis bacteriana.  La secrecin es espesa, blanca y con apariencia de queso en la infeccin por hongos.  La secrecin es espumosa y de color amarillo o verdoso en la tricomoniasis.  Mal olor vaginal.  En la vaginitis bacteriana puede haber olor a "pescado".  Picazn, dolor o hinchazn vaginal.  Relaciones sexuales dolorosas.  Dolor o ardor al Geographical information systems officer. En ocasiones puede no haber sntomas.  TRATAMIENTO  El tratamiento depende de la gravedad de la lesin.   La vaginitis bacteriana y la tricomoniasis a menudo se tratan con cremas o comprimidos antibiticos.  Las infecciones por hongos se tratan con medicamentos antifngicos, como cremas o supositorios vaginales.  La vaginitis viral no tiene [place], [employer] los sntomas pueden tratarse con medicamentos que [employer]. Su pareja sexual tambin debe ser tratarse.  La vaginitis atrfica puede tratarse con crema, comprimidos, supositorios, o anillo vaginal con estrgenos. Si tiene sequedad vaginal, los lubricantes y las cremas hidratantes pueden ayudarla. Posiblemente le pedirn que [address] [place], [employer]  o duchas perfumados.  El tratamiento de la vaginitis alrgica implica renunciar al uso del producto que est causando el problema. Las cremas vaginales pueden usarse para tratar los sntomas. INSTRUCCIONES PARA EL CUIDADO EN  EL HOGAR   Tome todos los medicamentos segn le indic su mdico.  Mantenga la zona vaginal limpia y seca. Evite el jabn y slo enjuague el rea con agua.  Evite la ducha vaginal. Puede eliminar las bacterias saludables que hay en la vagina.  No utilice tampones ni tenga relaciones sexuales hasta que el profesional la autorice. No use apsitos mientras tenga vaginitis.  Higiencese de adelante hacia atrs. Esto evita la propagacin de bacterias desde el recto hacia la vagina.  Deje que el aire llegue a su rea genital.  Use ropa interior de algodn para reducir la acumulacin de humedad.  Evite el uso de ropa interior cuando duerme hasta que la vaginitis haya mejorado.  Evite la ropa interior o medias de nylon ajustadas y que no tengan un panel de algodn.  Qutese la ropa hmeda (especialmente el traje de bao) tan pronto como sea posible.  Utilice productos suaves sin perfume. Evite el uso de sustancias irritantes como:  Aerosoles femeninos perfumados.  Suavizantes de tela.  Detergentes perfumados.  Tampones perfumados.  Jabones o baos de espuma perfumados.  Practique el sexo seguro y use condones. Los condones pueden prevenir la transmisin de la tricomoniasis y la vaginitis viral. Romona Curls ATENCIN MDICA SI:   Siente dolor abdominal.  Tiene fiebre o sntomas persistentes durante ms de 2  3 das.  Tiene fiebre y los sntomas empeoran repentinamente. Document Released: 12/28/2008 Document Revised: 06/05/2012 [facility] Patient Information 2015 [place], [place]. This information is not intended to replace advice given to you by your health care provider. Make sure you discuss any questions you have with your health care provider.

## 2015-06-01 NOTE — ED Notes (Signed)
Pt states she thinks she had an allergic reaction to an antibiotic at [place], but cannot remember what the name was. She had hives/rash.

## 2017-08-01 ENCOUNTER — Encounter: Payer: Self-pay | Admitting: *Deleted

## 2017-08-01 ENCOUNTER — Emergency Department: Payer: Self-pay

## 2017-08-01 ENCOUNTER — Emergency Department
Admission: EM | Admit: 2017-08-01 | Discharge: 2017-08-01 | Disposition: A | Discharge status: Home / Self Care | Payer: Self-pay | Attending: Emergency Medicine | Admitting: Emergency Medicine

## 2017-08-01 DIAGNOSIS — R300 Dysuria: Secondary | ICD-10-CM | POA: Insufficient documentation

## 2017-08-01 DIAGNOSIS — R002 Palpitations: Secondary | ICD-10-CM | POA: Insufficient documentation

## 2017-08-01 DIAGNOSIS — R102 Pelvic and perineal pain: Secondary | ICD-10-CM | POA: Insufficient documentation

## 2017-08-01 DIAGNOSIS — Z79899 Other long term (current) drug therapy: Secondary | ICD-10-CM | POA: Insufficient documentation

## 2017-08-01 LAB — COMPREHENSIVE METABOLIC PANEL
ALK PHOS: 109 U/L (ref 38–126)
ALT: 27 U/L (ref 14–54)
AST: 32 U/L (ref 15–41)
Albumin: 4.3 g/dL (ref 3.5–5.0)
Anion gap: 10 (ref 5–15)
BUN: 9 mg/dL (ref 6–20)
CO2: 24 mmol/L (ref 22–32)
CREATININE: 0.67 mg/dL (ref 0.44–1.00)
Calcium: 8.9 mg/dL (ref 8.9–10.3)
Chloride: 96 mmol/L — ABNORMAL LOW (ref 101–111)
GFR calc Af Amer: >60 mL/min (ref >60)
GLUCOSE: 101 mg/dL — AB (ref 65–99)
POTASSIUM: 4.2 mmol/L (ref 3.5–5.1)
Sodium: 130 mmol/L — ABNORMAL LOW (ref 135–145)
TOTAL PROTEIN: 8.3 g/dL — AB (ref 6.5–8.1)
Total Bilirubin: 1.6 mg/dL — ABNORMAL HIGH (ref 0.3–1.2)

## 2017-08-01 LAB — WET PREP, GENITAL
Clue Cells Wet Prep HPF POC: NONE SEEN
Sperm: NONE SEEN
Trich, Wet Prep: NONE SEEN
Yeast Wet Prep HPF POC: NONE SEEN

## 2017-08-01 LAB — CBC
HEMATOCRIT: 42 % (ref 35.0–47.0)
HEMOGLOBIN: 14 g/dL (ref 12.0–16.0)
MCH: 27.3 pg (ref 26.0–34.0)
MCHC: 33.4 g/dL (ref 32.0–36.0)
MCV: 81.6 fL (ref 80.0–100.0)
Platelets: 330 10*3/uL (ref 150–440)
RBC: 5.14 MIL/uL (ref 3.80–5.20)
RDW: 14.3 % (ref 11.5–14.5)
WBC: 13 10*3/uL — ABNORMAL HIGH (ref 3.6–11.0)

## 2017-08-01 LAB — URINALYSIS, COMPLETE (UACMP) WITH MICROSCOPIC
Bilirubin Urine: NEGATIVE
Glucose, UA: NEGATIVE mg/dL
Ketones, ur: NEGATIVE mg/dL
Nitrite: NEGATIVE
PROTEIN: NEGATIVE mg/dL
SPECIFIC GRAVITY, URINE: 1 — AB (ref 1.005–1.030)
SQUAMOUS EPITHELIAL / LPF: NONE SEEN
WBC UA: NONE SEEN WBC/hpf (ref 0–5)
pH: 7 (ref 5.0–8.0)

## 2017-08-01 LAB — CHLAMYDIA/NGC RT PCR (ARMC ONLY)
CHLAMYDIA TR: NOT DETECTED
N GONORRHOEAE: NOT DETECTED

## 2017-08-01 LAB — PREGNANCY, URINE: PREG TEST UR: NEGATIVE

## 2017-08-01 MED ORDER — KETOROLAC TROMETHAMINE 30 MG/ML IJ SOLN
30.0000 mg | Freq: Once | INTRAMUSCULAR | Status: AC
Start: 2017-08-01 — End: 2017-08-01
  Administered 2017-08-01: 30 mg via INTRAVENOUS
  Filled 2017-08-01: qty 1

## 2017-08-01 MED ORDER — CEPHALEXIN 500 MG PO CAPS: 500.0000 mg | ORAL_CAPSULE | Freq: Three times a day (TID) | ORAL | 0 refills | Status: AC

## 2017-08-01 MED ORDER — PHENAZOPYRIDINE HCL 200 MG PO TABS: 200.0000 mg | ORAL_TABLET | Freq: Three times a day (TID) | ORAL | 0 refills | Status: AC | PRN

## 2017-08-01 MED ORDER — SODIUM CHLORIDE 0.9 % IV BOLUS (SEPSIS)
1000.0000 mL | Freq: Once | INTRAVENOUS | Status: AC
  Administered 2017-08-01: 1000 mL via INTRAVENOUS

## 2017-08-01 NOTE — ED Notes (Signed)
With interpreter Pt verbalized understanding of discharge instructions. NAD at this time.

## 2017-08-01 NOTE — ED Triage Notes (Signed)
States this AM she developed pain in her vagina and painful urination, upon arrival to triage room pt shaking and states feeling SOB, states she might be nervous, states palpatations, intepeter used for triage

## 2017-08-01 NOTE — ED Provider Notes (Signed)
[facility] Emergency Department Provider Note  Time seen: 8:37 AM  I have reviewed the triage vital signs and the nursing notes.  Interpreter used for this evaluation  HISTORY  Chief Complaint Palpitations and Hematuria    HPI Jacqueline Pugh is a 51 y.o. female with no significant past medical history who presents to the emergency department for dysuria.  According to the patient since around 1:00 this morning she has been having painful urination with cloudy urination and vaginal discomfort.  He denies any abdominal pain.  States nausea but denies any vomiting.  Denies any fever.  Denies diarrhea.  Patient states she has had urinary tract infections in the past that it felt similar.  Patient also states she is feeling nervous and felt her heart racing earlier but states this has since resolved.  Denies any chest pain at any point.   History reviewed. No pertinent past medical history.  There are no active problems to display for this patient.   Past Surgical History:  Procedure Laterality Date  . ABDOMINAL HYSTERECTOMY      Prior to Admission medications   Medication Sig Start Date End Date Taking? Authorizing Provider  diazepam (VALIUM) 5 MG tablet Take 1 tablet (5 mg total) by mouth every 8 (eight) hours as needed for anxiety or muscle spasms. 06/01/15   Emily FilbertWilliams, Jonathan E, MD  fluconazole (DIFLUCAN) 150 MG tablet Take 1 tablet (150 mg total) by mouth daily. 06/01/15   Emily FilbertWilliams, Jonathan E, MD  sulfamethoxazole-trimethoprim (BACTRIM DS) 800-160 MG per tablet Take 1 tablet by mouth 2 (two) times daily. 06/01/15   Emily FilbertWilliams, Jonathan E, MD    No Known Allergies  History reviewed. No pertinent family history.  Social History Social History   Tobacco Use  . Smoking status: Never Smoker  Substance Use Topics  . Alcohol use: Not on file  . Drug use: Not on file    Review of Systems Constitutional: Negative for fever Cardiovascular: Negative for chest  pain.  Positive palpitations earlier, now resolved Respiratory: Negative for shortness of breath. Gastrointestinal: Negative for abdominal pain.  Positive for nausea but denies vomiting Genitourinary: Positive for dysuria All other ROS negative  ____________________________________________   PHYSICAL EXAM:  VITAL SIGNS: ED Triage Vitals  Enc Vitals Group     BP 08/01/17 0802 (!) 162/116     Pulse Rate 08/01/17 0802 (!) 104     Resp 08/01/17 0802 20     Temp 08/01/17 0802 98.3 F (36.8 C)     Temp Source 08/01/17 0802 Oral     SpO2 08/01/17 0802 100 %     Weight 08/01/17 0816 180 lb (81.6 kg)     Height 08/01/17 0816 5' (1.524 m)     Head Circumference --      Peak Flow --      Pain Score 08/01/17 0813 10     Pain Loc --      Pain Edu? --      Excl. in GC? --     Constitutional: Alert and oriented. Well appearing and in no distress. Eyes: Normal exam ENT   Head: Normocephalic and atraumatic   Mouth/Throat: Mucous membranes are moist. Cardiovascular: Normal rate, regular rhythm. No murmur Respiratory: Normal respiratory effort without tachypnea nor retractions. Breath sounds are clear Gastrointestinal: Soft and nontender. No distention.  Completely nontender abdomen.  No CVA tenderness. Musculoskeletal: Nontender with normal range of motion in all extremities.  Neurologic:  Normal speech and language. No gross  focal neurologic deficits  Skin:  Skin is warm, dry and intact.  Psychiatric: Mood and affect are normal.   ____________________________________________    EKG  EKG reviewed and interpreted by myself shows normal sinus rhythm at 92 bpm, narrow QRS, normal axis, normal intervals, nonspecific with no concerning ST changes.  ____________________________________________   INITIAL IMPRESSION / ASSESSMENT AND PLAN / ED COURSE  Pertinent labs & imaging results that were available during my care of the patient were reviewed by me and considered in my medical  decision making (see chart for details).  Patient presents to the emergency department for dysuria and cloudy urine since 1:00 this morning.  We will check labs, treat with Toradol, fluids and closely monitor.  Differential would include urinary tract infection, pelvic/vaginal infection, ureterolithiasis, other lower abdominal pathology.  Reassuringly the patient has a completely benign abdominal exam, is afebrile.  Lab work pending.  Patient's labs show a slight white blood cell count elevation, otherwise are largely normal.  Urinalysis shows rare bacteria and no white cells, urine culture has been sent.  Pelvic exam shows a normal amount of discharge, no cervical loss identified.  On digital exam patient is nontender.  Awaiting wet prep to return, we will also order a CT scan of the abdomen/pelvis to further evaluate to rule out ureterolithiasis.  CT scan is negative.  Patient's wet prep and pelvic swabs are negative.  We will cover with antibiotics for possible urinary tract infection.  A urine culture has been sent.  We will also placed on Pyridium, the patient will follow up with her primary care doctor. ____________________________________________   FINAL CLINICAL IMPRESSION(S) / ED DIAGNOSES  Dysuria    Minna AntisPaduchowski, Starlin Steib, MD 08/01/17 1239

## 2017-08-01 NOTE — ED Notes (Signed)
MD Paduchowski at bedside  

## 2017-08-03 LAB — URINE CULTURE

## 2018-08-09 IMAGING — CT CT RENAL STONE PROTOCOL
2 of 4 series · 16 of 46 positions shown, 18 images · non-contrast
Comparison: 01/18/2014

CLINICAL DATA: Dysuria.  Pain and vagina.  Painful urination.

EXAM:
CT ABDOMEN AND PELVIS WITHOUT CONTRAST
TECHNIQUE: Multidetector CT imaging of the abdomen and pelvis was performed
following the standard protocol without IV contrast.

[Series 2: stone full standard · axial · 0.73mm/px · z∈[+84,+529]mm · 13 of 99 slices shown, 15 images]
[im 5/99  soft-tissue]
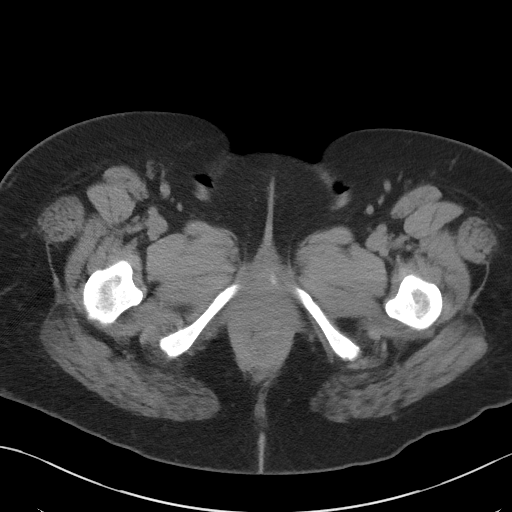
[im 5/99  bone]
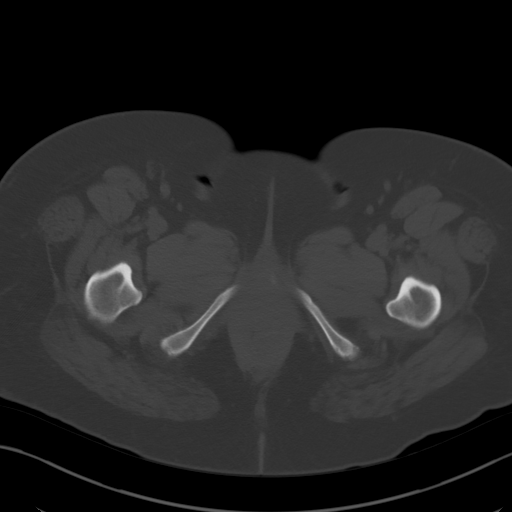
[im 15/99  soft-tissue]
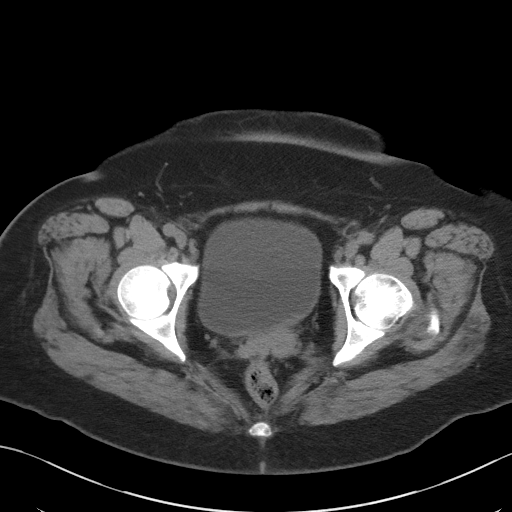
[im 19/99  soft-tissue]
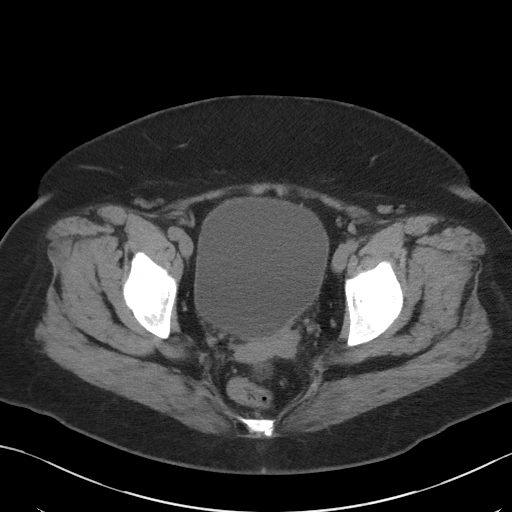
[im 29/99  soft-tissue]
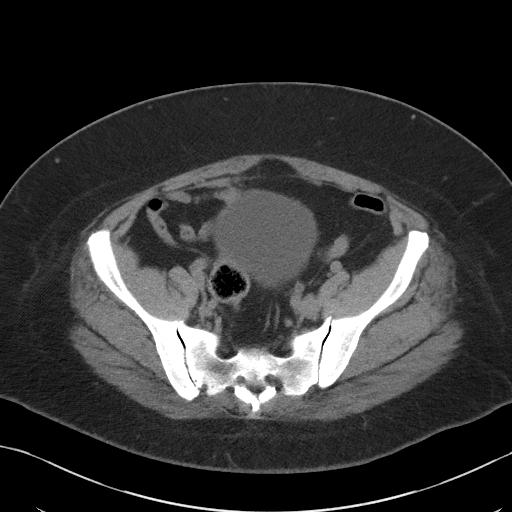
[im 33/99  soft-tissue]
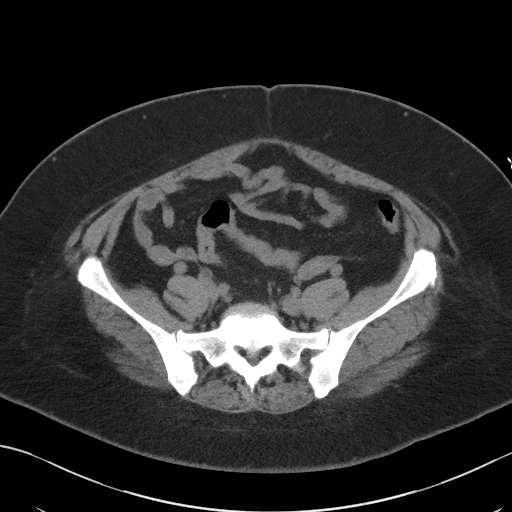
[im 43/99  soft-tissue]
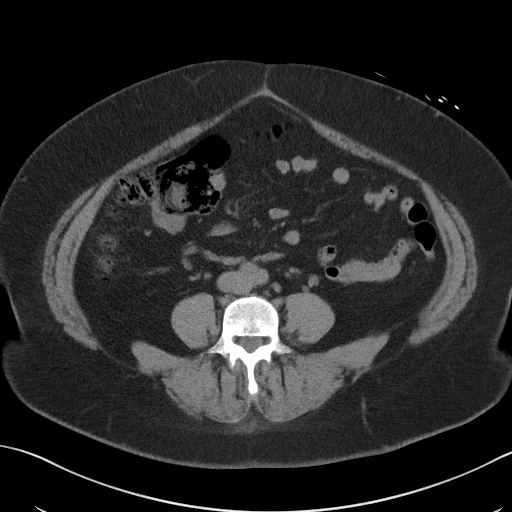
[im 52/99  soft-tissue]
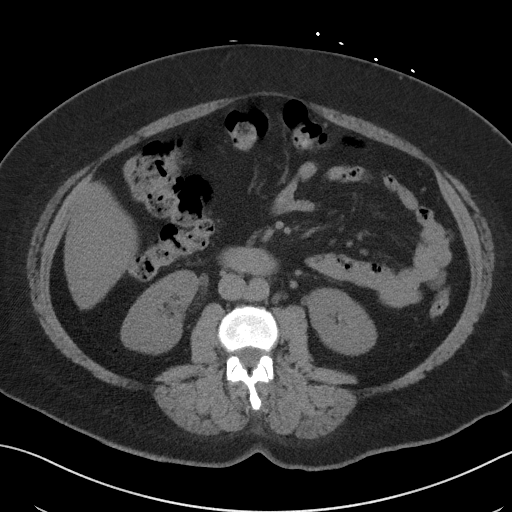
[im 57/99  soft-tissue]
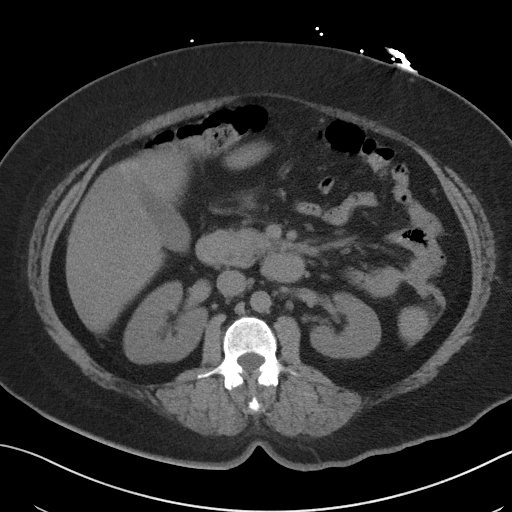
[im 66/99  soft-tissue]
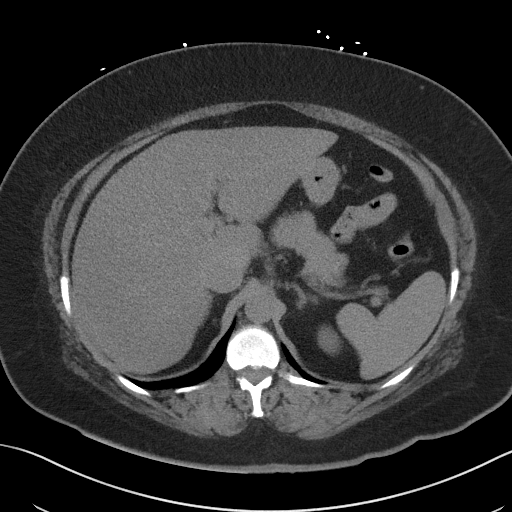
[im 66/99  bone]
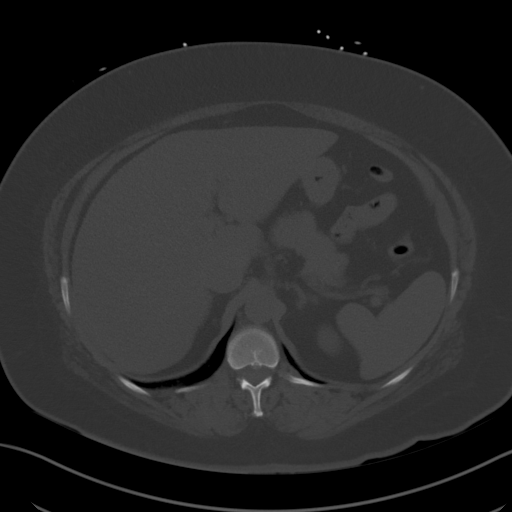
[im 71/99  soft-tissue]
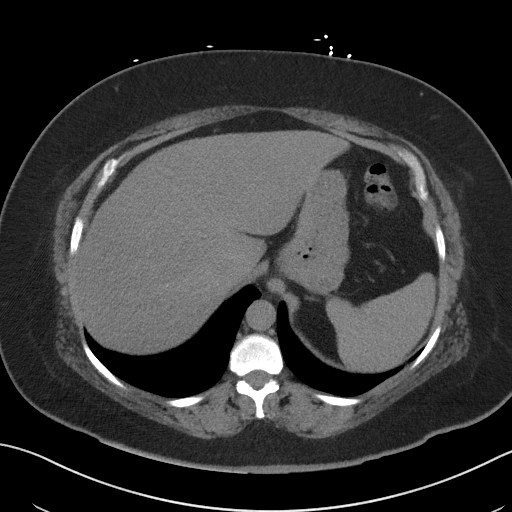
[im 80/99  soft-tissue]
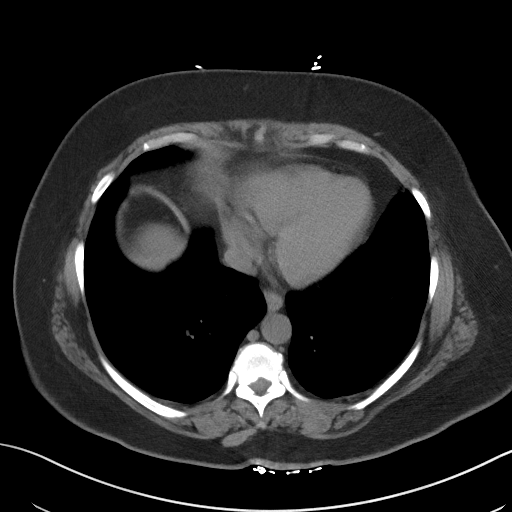
[im 85/99  soft-tissue]
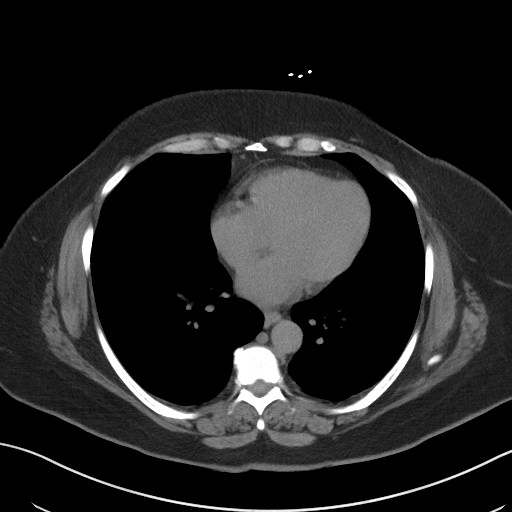
[im 94/99  soft-tissue]
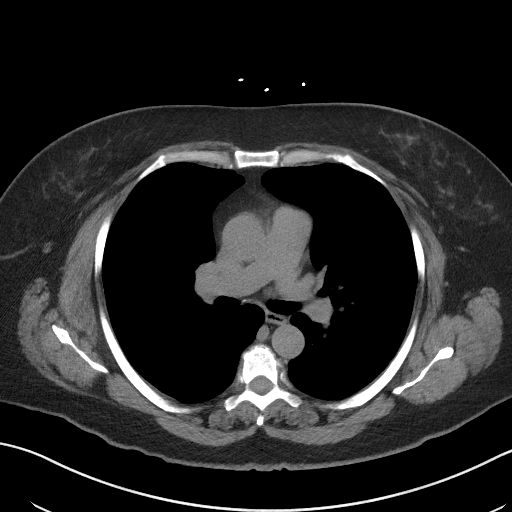

[Series 5: coronal · coronal · 0.86mm/px · 3 of 160 slices shown]
[im 54/160  soft-tissue]
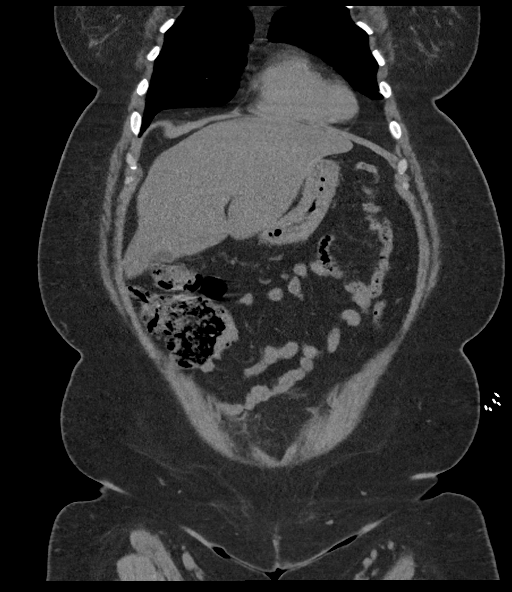
[im 71/160  soft-tissue]
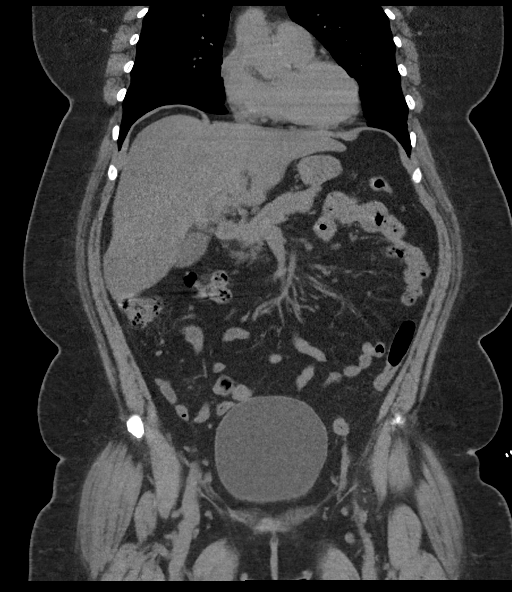
[im 89/160  soft-tissue]
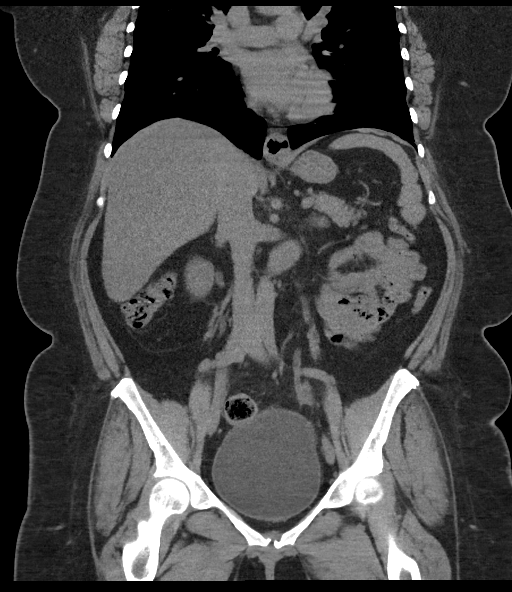

[16 of 46 positions shown; findings below may reference images not displayed]

FINDINGS: Lower chest: No acute abnormality.

Hepatobiliary: Moderate hepatic steatosis. The gallbladder appears
normal. No biliary dilatation.

Pancreas: Unremarkable. No pancreatic ductal dilatation or
surrounding inflammatory changes.

Spleen: Normal in size without focal abnormality.

Adrenals/Urinary Tract: Adrenal glands are unremarkable. Kidneys are
normal, without renal calculi, focal lesion, or hydronephrosis.
Bladder is unremarkable.

Stomach/Bowel: Small hiatal hernia. Stomach is otherwise within
normal limits. Appendix appears normal. No evidence of bowel wall
thickening, distention, or inflammatory changes.

Vascular/Lymphatic: No significant vascular findings are present. No
enlarged abdominal or pelvic lymph nodes.

Reproductive: Status post hysterectomy. No adnexal masses.

Other: No abdominal wall hernia or abnormality. No abdominopelvic
ascites.

Musculoskeletal: No acute or significant osseous findings.
IMPRESSION: 1. No acute findings within the abdomen or pelvis.
2. Hepatic steatosis
3. Small hiatal hernia.
4. Previous hysterectomy.
